# Patient Record
Sex: Female | Born: 1970 | Race: White | Hispanic: No | Marital: Married | Smoking: Never smoker
Health system: Southern US, Community
[De-identification: ages and names within clinical notes are randomized; demographics above are authoritative.]

## PROBLEM LIST (undated history)

## (undated) DIAGNOSIS — K648 Other hemorrhoids: Secondary | ICD-10-CM

## (undated) DIAGNOSIS — K449 Diaphragmatic hernia without obstruction or gangrene: Secondary | ICD-10-CM

## (undated) DIAGNOSIS — E785 Hyperlipidemia, unspecified: Secondary | ICD-10-CM

## (undated) HISTORY — DX: Other hemorrhoids: K64.8

## (undated) HISTORY — PX: OTHER SURGICAL HISTORY: SHX169

## (undated) HISTORY — DX: Diaphragmatic hernia without obstruction or gangrene: K44.9

## (undated) HISTORY — DX: Hyperlipidemia, unspecified: E78.5

---

## 2007-01-31 ENCOUNTER — Other Ambulatory Visit: Admission: RE | Admit: 2007-01-31 | Discharge: 2007-01-31 | Payer: Self-pay | Admitting: Family Medicine

## 2008-07-05 ENCOUNTER — Ambulatory Visit (HOSPITAL_COMMUNITY): Admission: RE | Admit: 2008-07-05 | Discharge: 2008-07-05 | Payer: Self-pay | Admitting: Family Medicine

## 2008-07-06 ENCOUNTER — Ambulatory Visit (HOSPITAL_COMMUNITY): Admission: RE | Admit: 2008-07-06 | Discharge: 2008-07-06 | Payer: Self-pay | Admitting: Family Medicine

## 2009-12-19 ENCOUNTER — Emergency Department (HOSPITAL_COMMUNITY): Admission: EM | Admit: 2009-12-19 | Discharge: 2009-12-19 | Payer: Self-pay | Admitting: Emergency Medicine

## 2010-07-06 ENCOUNTER — Encounter: Payer: Self-pay | Admitting: Family Medicine

## 2010-10-24 ENCOUNTER — Other Ambulatory Visit (HOSPITAL_COMMUNITY)
Admission: RE | Admit: 2010-10-24 | Discharge: 2010-10-24 | Disposition: A | Discharge status: Home / Self Care | Payer: BC Managed Care – PPO | Source: Ambulatory Visit | Attending: Family Medicine | Admitting: Family Medicine

## 2010-10-24 DIAGNOSIS — Z124 Encounter for screening for malignant neoplasm of cervix: Secondary | ICD-10-CM | POA: Insufficient documentation

## 2010-10-24 DIAGNOSIS — Z1159 Encounter for screening for other viral diseases: Secondary | ICD-10-CM | POA: Insufficient documentation

## 2013-01-12 ENCOUNTER — Other Ambulatory Visit: Payer: Self-pay

## 2013-01-12 DIAGNOSIS — Z1231 Encounter for screening mammogram for malignant neoplasm of breast: Secondary | ICD-10-CM

## 2013-01-26 ENCOUNTER — Other Ambulatory Visit: Payer: Self-pay | Admitting: Family Medicine

## 2013-01-26 ENCOUNTER — Ambulatory Visit
Admission: RE | Admit: 2013-01-26 | Discharge: 2013-01-26 | Disposition: A | Discharge status: Home / Self Care | Payer: BC Managed Care – PPO | Source: Ambulatory Visit

## 2013-01-26 DIAGNOSIS — R928 Other abnormal and inconclusive findings on diagnostic imaging of breast: Secondary | ICD-10-CM

## 2013-01-26 DIAGNOSIS — Z1231 Encounter for screening mammogram for malignant neoplasm of breast: Secondary | ICD-10-CM

## 2013-02-14 ENCOUNTER — Ambulatory Visit
Admission: RE | Admit: 2013-02-14 | Discharge: 2013-02-14 | Disposition: A | Discharge status: Home / Self Care | Payer: BC Managed Care – PPO | Source: Ambulatory Visit | Attending: Family Medicine | Admitting: Family Medicine

## 2013-02-14 DIAGNOSIS — R928 Other abnormal and inconclusive findings on diagnostic imaging of breast: Secondary | ICD-10-CM

## 2013-04-03 ENCOUNTER — Other Ambulatory Visit (HOSPITAL_COMMUNITY)
Admission: RE | Admit: 2013-04-03 | Discharge: 2013-04-03 | Disposition: A | Discharge status: Home / Self Care | Payer: BC Managed Care – PPO | Source: Ambulatory Visit | Attending: Family Medicine | Admitting: Family Medicine

## 2013-04-03 ENCOUNTER — Other Ambulatory Visit: Payer: Self-pay | Admitting: Family Medicine

## 2013-04-03 DIAGNOSIS — Z0000 Encounter for general adult medical examination without abnormal findings: Secondary | ICD-10-CM | POA: Insufficient documentation

## 2013-05-03 ENCOUNTER — Other Ambulatory Visit: Payer: Self-pay | Admitting: Family Medicine

## 2013-05-03 DIAGNOSIS — N926 Irregular menstruation, unspecified: Secondary | ICD-10-CM

## 2013-05-15 ENCOUNTER — Ambulatory Visit
Admission: RE | Admit: 2013-05-15 | Discharge: 2013-05-15 | Disposition: A | Discharge status: Home / Self Care | Payer: BC Managed Care – PPO | Source: Ambulatory Visit | Attending: Family Medicine | Admitting: Family Medicine

## 2013-05-15 DIAGNOSIS — N926 Irregular menstruation, unspecified: Secondary | ICD-10-CM

## 2014-04-17 ENCOUNTER — Other Ambulatory Visit: Payer: Self-pay

## 2014-04-17 DIAGNOSIS — Z1231 Encounter for screening mammogram for malignant neoplasm of breast: Secondary | ICD-10-CM

## 2014-05-21 ENCOUNTER — Ambulatory Visit
Admission: RE | Admit: 2014-05-21 | Discharge: 2014-05-21 | Disposition: A | Discharge status: Home / Self Care | Payer: BC Managed Care – PPO | Source: Ambulatory Visit

## 2014-05-21 DIAGNOSIS — Z1231 Encounter for screening mammogram for malignant neoplasm of breast: Secondary | ICD-10-CM

## 2014-05-23 ENCOUNTER — Other Ambulatory Visit: Payer: Self-pay | Admitting: Family Medicine

## 2014-05-23 DIAGNOSIS — R928 Other abnormal and inconclusive findings on diagnostic imaging of breast: Secondary | ICD-10-CM

## 2014-06-07 ENCOUNTER — Ambulatory Visit
Admission: RE | Admit: 2014-06-07 | Discharge: 2014-06-07 | Disposition: A | Discharge status: Home / Self Care | Payer: BC Managed Care – PPO | Source: Ambulatory Visit | Attending: Family Medicine | Admitting: Family Medicine

## 2014-06-07 DIAGNOSIS — R928 Other abnormal and inconclusive findings on diagnostic imaging of breast: Secondary | ICD-10-CM

## 2016-01-20 ENCOUNTER — Other Ambulatory Visit (HOSPITAL_COMMUNITY)
Admission: RE | Admit: 2016-01-20 | Discharge: 2016-01-20 | Disposition: A | Discharge status: Home / Self Care | Payer: BC Managed Care – PPO | Source: Ambulatory Visit | Attending: Family Medicine | Admitting: Family Medicine

## 2016-01-20 ENCOUNTER — Other Ambulatory Visit: Payer: Self-pay | Admitting: Family Medicine

## 2016-01-20 DIAGNOSIS — Z01419 Encounter for gynecological examination (general) (routine) without abnormal findings: Secondary | ICD-10-CM | POA: Insufficient documentation

## 2016-01-21 LAB — CYTOLOGY - PAP

## 2017-03-26 ENCOUNTER — Other Ambulatory Visit: Payer: Self-pay | Admitting: Family Medicine

## 2017-03-26 DIAGNOSIS — Z1231 Encounter for screening mammogram for malignant neoplasm of breast: Secondary | ICD-10-CM

## 2017-04-13 ENCOUNTER — Ambulatory Visit
Admission: RE | Admit: 2017-04-13 | Discharge: 2017-04-13 | Disposition: A | Discharge status: Home / Self Care | Payer: BC Managed Care – PPO | Source: Ambulatory Visit | Attending: Family Medicine | Admitting: Family Medicine

## 2017-04-13 DIAGNOSIS — Z1231 Encounter for screening mammogram for malignant neoplasm of breast: Secondary | ICD-10-CM

## 2018-11-29 ENCOUNTER — Other Ambulatory Visit: Payer: Self-pay | Admitting: Family Medicine

## 2018-11-29 ENCOUNTER — Ambulatory Visit
Admission: RE | Admit: 2018-11-29 | Discharge: 2018-11-29 | Disposition: A | Discharge status: Home / Self Care | Payer: BC Managed Care – PPO | Source: Ambulatory Visit | Attending: Family Medicine | Admitting: Family Medicine

## 2018-11-29 ENCOUNTER — Other Ambulatory Visit: Payer: Self-pay

## 2018-11-29 DIAGNOSIS — Z1231 Encounter for screening mammogram for malignant neoplasm of breast: Secondary | ICD-10-CM

## 2018-12-08 ENCOUNTER — Other Ambulatory Visit: Payer: Self-pay | Admitting: Family Medicine

## 2018-12-08 ENCOUNTER — Other Ambulatory Visit (HOSPITAL_COMMUNITY)
Admission: RE | Admit: 2018-12-08 | Discharge: 2018-12-08 | Disposition: A | Discharge status: Home / Self Care | Payer: BC Managed Care – PPO | Source: Ambulatory Visit | Attending: Family Medicine | Admitting: Family Medicine

## 2018-12-08 DIAGNOSIS — Z124 Encounter for screening for malignant neoplasm of cervix: Secondary | ICD-10-CM | POA: Insufficient documentation

## 2018-12-09 LAB — CYTOLOGY - PAP
Diagnosis: NEGATIVE
HPV: NOT DETECTED

## 2019-11-30 ENCOUNTER — Other Ambulatory Visit: Payer: Self-pay | Admitting: Family Medicine

## 2019-11-30 DIAGNOSIS — Z1231 Encounter for screening mammogram for malignant neoplasm of breast: Secondary | ICD-10-CM

## 2019-12-01 ENCOUNTER — Other Ambulatory Visit: Payer: Self-pay

## 2019-12-01 ENCOUNTER — Ambulatory Visit
Admission: RE | Admit: 2019-12-01 | Discharge: 2019-12-01 | Disposition: A | Discharge status: Home / Self Care | Payer: BC Managed Care – PPO | Source: Ambulatory Visit | Attending: Family Medicine | Admitting: Family Medicine

## 2019-12-01 DIAGNOSIS — Z1231 Encounter for screening mammogram for malignant neoplasm of breast: Secondary | ICD-10-CM

## 2020-01-15 ENCOUNTER — Encounter: Payer: Self-pay | Admitting: Cardiology

## 2020-01-18 NOTE — Progress Notes (Signed)
Cardiology Office Note   Date:  01/19/2020   ID:  Jodi Blackburn, DOB 1971-03-06, MRN [id number]  PCP:  Laurann Montana, MD  Cardiologist:   Rollene Rotunda, MD Referring:  Laurann Montana, MD  Chief Complaint  Patient presents with   Shortness of Breath   Edema      History of Present Illness: Jodi Blackburn is a 49 y.o. female who was referred by Laurann Montana, MD for evaluation cardiac decreased exercise tolerance.  The patient has no prior cardiac history.  She presents with a constellation of symptoms that includes occasional dizziness.  She has had numbness in her feet and hands.  Has had occasional intermittent ankle swelling and a bloating feeling.  She ascribes all of this to probably going through menopause.  She might notice some shortness of breath with activity such as vacuuming.  She uses an elliptical and she needs to take breaks.  She feels sometimes like she cannot take a deep breath.  She has had vague squeezing chest discomfort.  She is occasionally had some episodes of being pale and presyncope but this is very uncommon.  She is never had any prior cardiac history.  She is not had any prior cardiac work-up.  She is not describing substernal chest pressure, neck or arm discomfort.  She is not describing PND or orthopnea.  Past Medical History:  Diagnosis Date   Dyslipidemia    Hiatal hernia    Internal hemorrhoids     Past Surgical History:  Procedure Laterality Date   None       Current Outpatient Medications  Medication Sig Dispense Refill   Cholecalciferol 25 MCG (1000 UT) tablet Take by mouth.     esomeprazole (NEXIUM) 20 MG capsule Take by mouth.     estradiol (ESTRACE) 0.1 MG/GM vaginal cream Place vaginally.     fluticasone (FLONASE) 50 MCG/ACT nasal spray Place into the nose.     No current facility-administered medications for this visit.    Allergies:   Patient has no allergy information on record.    Social History:  The patient   reports that she has never smoked. She has never used smokeless tobacco. She reports current alcohol use of about 2.0 standard drinks of alcohol per week. She reports that she does not use drugs.   Family History:  The patient's family history includes Hypertension in her mother and sister.    ROS:  Please see the history of present illness.   Otherwise, review of systems are positive for none.   All other systems are reviewed and negative.    PHYSICAL EXAM: VS:  BP 116/78 (BP Location: Left Arm, Patient Position: Sitting, Cuff Size: Normal)    Pulse (!) 50    Ht 5\' 6"  (1.676 m)    Wt 168 lb 3.2 oz (76.3 kg)    BMI 27.15 kg/m  , BMI Body mass index is 27.15 kg/m. GENERAL:  Well appearing HEENT:  Pupils equal round and reactive, fundi not visualized, oral mucosa unremarkable NECK:  No jugular venous distention, waveform within normal limits, carotid upstroke brisk and symmetric, no bruits, no thyromegaly LYMPHATICS:  No cervical, inguinal adenopathy LUNGS:  Clear to auscultation bilaterally BACK:  No CVA tenderness CHEST:  Unremarkable HEART:  PMI not displaced or sustained,S1 and S2 within normal limits, no S3, no S4, no clicks, no rubs, no murmurs ABD:  Flat, positive bowel sounds normal in frequency in pitch, no bruits, no rebound, no guarding, no midline pulsatile  mass, no hepatomegaly, no splenomegaly EXT:  2 plus pulses throughout, no edema, no cyanosis no clubbing SKIN:  No rashes no nodules NEURO:  Cranial nerves II through XII grossly intact, motor grossly intact throughout PSYCH:  Cognitively intact, oriented to person place and time    EKG:  EKG is ordered today. The ekg ordered today demonstrates sinus bradycardia, rate 50, axis within normal limits, short PR interval, low voltage limb and chest leads, no acute ST-T wave changes.   Recent Labs: No results found for requested labs within last 8760 hours.    Lipid Panel No results found for: CHOL, TRIG, HDL, CHOLHDL,  VLDL, LDLCALC, LDLDIRECT    Wt Readings from Last 3 Encounters:  01/19/20 168 lb 3.2 oz (76.3 kg)      Other studies Reviewed: Additional studies/ records that were reviewed today include: Labs and primary care records. Review of the above records demonstrates:  Please see elsewhere in the note.     ASSESSMENT AND PLAN:  Bradycardia:    The patient does have bradycardia but no symptoms related to this.  Her heart rate went up appropriately when she ambulated around the office.  She says it goes up appropriately with exercise.  At this point no change in therapy.  Dyslipidemia: Her LDL has been significantly elevated at 167.  I am going to start with a coronary calcium score which will help [place] understand goals of therapy and whether any further screening is necessary.  Decreased exercise tolerance: She has an atypical constellation of symptoms.  She is not having any chest discomfort suggestive of unstable angina.  She has minimal cardiovascular risk factors.  She has some low voltages on her EKG but is otherwise unremarkable.  Blood work has been unremarkable.  I am going to start with the testing above but at this point would not have a high suspicion of obstructive coronary disease or structural heart disease.  (See below in the comments regarding the echocardiogram)  Abnormal EKG: She has some EKG with some low voltages but otherwise unremarkable.  I am going to have a low threshold to screen her with an echocardiogram pending the results above.  Dizziness: Patient has some vague symptoms of lightheadedness dizziness and did have a slight drop in her blood pressure with standing.  She has baseline low blood pressure.  I have asked her to hydrate.  Edema: She has had some intermittent leg swelling but otherwise no suggestion of venous problems or heart failure.  There is no evidence of swelling today.  She is to watch salt and fluid intake and see if there is some correlation.  At this  point no change in therapy.  COVID education: She has had her vaccine.  Current medicines are reviewed at length with the patient today.  The patient does not have concerns regarding medicines.  The following changes have been made:  no change  Labs/ tests ordered today include:   Orders Placed This Encounter  Procedures   CT CARDIAC SCORING   EKG 12-Lead     Disposition:   FU with as needed and as based on the results of the testing above.   Signed, Rollene Rotunda, MD  01/19/2020 12:46 PM    [facility]

## 2020-01-19 ENCOUNTER — Encounter: Payer: Self-pay | Admitting: Cardiology

## 2020-01-19 ENCOUNTER — Other Ambulatory Visit: Payer: Self-pay

## 2020-01-19 ENCOUNTER — Ambulatory Visit (INDEPENDENT_AMBULATORY_CARE_PROVIDER_SITE_OTHER): Payer: BC Managed Care – PPO | Admitting: Cardiology

## 2020-01-19 VITALS — BP 116/78 | HR 50 | Ht 66.0 in | Wt 168.2 lb

## 2020-01-19 DIAGNOSIS — R6889 Other general symptoms and signs: Secondary | ICD-10-CM | POA: Diagnosis not present

## 2020-01-19 DIAGNOSIS — R001 Bradycardia, unspecified: Secondary | ICD-10-CM | POA: Diagnosis not present

## 2020-01-19 DIAGNOSIS — R9431 Abnormal electrocardiogram [ECG] [EKG]: Secondary | ICD-10-CM

## 2020-01-19 DIAGNOSIS — Z7189 Other specified counseling: Secondary | ICD-10-CM

## 2020-01-19 NOTE — Patient Instructions (Signed)
Medication Instructions:  Your physician recommends that you continue on your current medications as directed. Please refer to the Current Medication list given to you today.  *If you need a refill on your cardiac medications before your next appointment, please call your pharmacy*   Testing/Procedures: Your physician has requested that you have cardiac CT (Calcium Score). Cardiac computed tomography (CT) is a painless test that uses an x-ray machine to take clear, detailed pictures of your heart. For further information please visit [email or web address] Please follow instruction sheet as given.   Follow-Up: At [facility], you and your health needs are our priority.  As part of our continuing mission to provide you with exceptional heart care, we have created designated Provider Care Teams.  These Care Teams include your primary Cardiologist (physician) and Advanced Practice Providers (APPs -  Physician Assistants and Nurse Practitioners) who all work together to provide you with the care you need, when you need it.  We recommend signing up for the patient portal called "MyChart".  Sign up information is provided on this After Visit Summary.  MyChart is used to connect with patients for Virtual Visits (Telemedicine).  Patients are able to view lab/test results, encounter notes, upcoming appointments, etc.  Non-urgent messages can be sent to your provider as well.   To learn more about what you can do with MyChart, go to [email or web address].    Your next appointment:   We will call you with your results to let you know when to follow-up.

## 2020-01-25 ENCOUNTER — Other Ambulatory Visit: Payer: Self-pay

## 2020-01-25 ENCOUNTER — Ambulatory Visit (INDEPENDENT_AMBULATORY_CARE_PROVIDER_SITE_OTHER)
Admission: RE | Admit: 2020-01-25 | Discharge: 2020-01-25 | Disposition: A | Discharge status: Home / Self Care | Payer: Self-pay | Source: Ambulatory Visit | Attending: Cardiology | Admitting: Cardiology

## 2020-01-25 DIAGNOSIS — R9431 Abnormal electrocardiogram [ECG] [EKG]: Secondary | ICD-10-CM

## 2020-02-02 ENCOUNTER — Telehealth: Payer: Self-pay | Admitting: Cardiology

## 2020-02-02 DIAGNOSIS — R0602 Shortness of breath: Secondary | ICD-10-CM

## 2020-02-02 DIAGNOSIS — R9431 Abnormal electrocardiogram [ECG] [EKG]: Secondary | ICD-10-CM

## 2020-02-02 NOTE — Telephone Encounter (Signed)
Patient is returning call to discuss results from CT completed on 01/25/20.

## 2020-02-02 NOTE — Telephone Encounter (Signed)
Left message for pt to call.

## 2020-02-05 NOTE — Telephone Encounter (Signed)
Lm to call back ./cy 

## 2020-02-06 NOTE — Telephone Encounter (Signed)
Spoke to patient coronary calcium score results given.Dr.Hochrein advised needs a echo.Scheduler will call back with echo appointment.

## 2020-02-12 ENCOUNTER — Telehealth: Payer: Self-pay | Admitting: *Deleted

## 2020-02-12 NOTE — Telephone Encounter (Signed)
-----   Message from Rollene Rotunda, MD sent at 01/26/2020  5:54 PM EDT ----- She had no calcium and no abnormalities on this EKG.  I would suggest a follow up echo because there are some low voltages on the EKG that are non specific.  However, for completeness, and with her symptoms, I would like to check an echo.  I tried to call her Friday at 6 pm but could not leave a message.  Call [name] with the results and send results to Laurann Montana, MD

## 2020-02-12 NOTE — Telephone Encounter (Signed)
Spoke with patient and advised of results Has Echo scheduled for next week Per patient she does have history of syncope, last episode a couple of years ago States she has issues with dizziness/lightheadedness  Recent labs at PCP were ok  She continues to have intermittent chest pain Concerned about low heartate  Will forward to Dr Antoine Poche for review

## 2020-02-15 NOTE — Telephone Encounter (Signed)
Can schedule follow up with me after the echo.

## 2020-02-23 ENCOUNTER — Ambulatory Visit (HOSPITAL_COMMUNITY): Payer: BC Managed Care – PPO | Attending: Cardiovascular Disease

## 2020-02-23 ENCOUNTER — Other Ambulatory Visit: Payer: Self-pay

## 2020-02-23 DIAGNOSIS — R9431 Abnormal electrocardiogram [ECG] [EKG]: Secondary | ICD-10-CM | POA: Diagnosis not present

## 2020-02-23 DIAGNOSIS — R0602 Shortness of breath: Secondary | ICD-10-CM | POA: Insufficient documentation

## 2020-02-23 LAB — ECHOCARDIOGRAM COMPLETE
Area-P 1/2: 4.96 cm2
S' Lateral: 3.1 cm

## 2020-02-23 NOTE — Telephone Encounter (Signed)
Advised patient and sent message to schedulers to arrange.

## 2020-02-26 NOTE — Telephone Encounter (Signed)
Advised patient of appointment date and time. 

## 2020-02-26 NOTE — Telephone Encounter (Signed)
Scheduled patient appointment 02/29/2020 at 10:00, left message to call back

## 2020-02-28 DIAGNOSIS — R42 Dizziness and giddiness: Secondary | ICD-10-CM | POA: Insufficient documentation

## 2020-02-28 DIAGNOSIS — M7989 Other specified soft tissue disorders: Secondary | ICD-10-CM | POA: Insufficient documentation

## 2020-02-28 NOTE — Progress Notes (Signed)
Cardiology Office Note   Date:  02/29/2020   ID:  Jodi Blackburn, DOB 1970-09-26, MRN [id number]  PCP:  Laurann Montana, MD  Cardiologist:   Rollene Rotunda, MD Referring:  Laurann Montana, MD   Chief Complaint  Patient presents with  . Decreased exercise tolerance     History of Present Illness: Jodi Blackburn is a 49 y.o. female who was referred by Laurann Montana, MD for evaluation cardiac decreased exercise tolerance.   She had an unremarkable echo.  Coronary calcium was zero.     She returns today for follow up and has done OK.  She has no new complaints.  She still has some vague symptoms of some mild leg swelling and decreased exercise tolerance but no acute chest pressure, neck or arm discomfort.  She had no new medications, presyncope or syncope.   Past Medical History:  Diagnosis Date  . Dyslipidemia   . Hiatal hernia   . Internal hemorrhoids     Past Surgical History:  Procedure Laterality Date  . None       Current Outpatient Medications  Medication Sig Dispense Refill  . Cholecalciferol 25 MCG (1000 UT) tablet Take by mouth.    . esomeprazole (NEXIUM) 20 MG capsule Take by mouth.    . estradiol (ESTRACE) 0.1 MG/GM vaginal cream Place vaginally.    . fluticasone (FLONASE) 50 MCG/ACT nasal spray Place into the nose.     No current facility-administered medications for this visit.    Allergies:   Patient has no allergy information on record.    ROS:  Please see the history of present illness.   Otherwise, review of systems are positive for none.   All other systems are reviewed and negative.    PHYSICAL EXAM: VS:  BP 112/60   Pulse 74   Temp (!) 97.5 F (36.4 C)   Ht 5\' 6"  (1.676 m)   Wt 167 lb (75.8 kg)   SpO2 98%   BMI 26.95 kg/m  , BMI Body mass index is 26.95 kg/m. GENERAL:  Well appearing NECK:  No jugular venous distention, waveform within normal limits, carotid upstroke brisk and symmetric, no bruits, no thyromegaly LUNGS:  Clear to  auscultation bilaterally CHEST:  Unremarkable HEART:  PMI not displaced or sustained,S1 and S2 within normal limits, no S3, no S4, no clicks, no rubs, no murmurs ABD:  Flat, positive bowel sounds normal in frequency in pitch, no bruits, no rebound, no guarding, no midline pulsatile mass, no hepatomegaly, no splenomegaly EXT:  2 plus pulses throughout, no edema, no cyanosis no clubbing   EKG:  EKG is not ordered today.  Recent Labs: No results found for requested labs within last 8760 hours.    Lipid Panel No results found for: CHOL, TRIG, HDL, CHOLHDL, VLDL, LDLCALC, LDLDIRECT    Wt Readings from Last 3 Encounters:  02/29/20 167 lb (75.8 kg)  01/19/20 168 lb 3.2 oz (76.3 kg)      Other studies Reviewed: Additional studies/ records that were reviewed today include:   None Review of the above records demonstrates:  Please see elsewhere in the note.     ASSESSMENT AND PLAN:  Bradycardia:     Her heart rate goes up with activity.  She has no further episodes of syncope.  Of note she did not tell me about this but she did have an episode of this apparently many months ago and was evaluated.  At this point I do not think she is having  any issues that would require monitoring but she would let me know if she has any presyncope or syncope in the future.  Dyslipidemia:  The calcium score was zero.  Given the calcium score of 0 she would need good diet control but not a statin at this point.  She can have this followed by her primary provider.  Decreased exercise tolerance:   Given a 0 calcium score and normal echocardiogram she is instructed to go out and start exercise and she should have improvement in her exercise tolerance with the regimen that she slowly increases.  If not she will let me know but at this point I am not suspecting cardiac limitations or suggesting any further cardiac evaluation.   COVID education:   Infection already had her vaccine but she actually had not.  We had a  long conversation about this today and she thinks she will get it.  Current medicines are reviewed at length with the patient today.  The patient does not have concerns regarding medicines.  The following changes have been made:  None  Labs/ tests ordered today include: None  No orders of the defined types were placed in this encounter.    Disposition:   FU me as needed.     Signed, Rollene Rotunda, MD  02/29/2020 10:52 AM    [facility]

## 2020-02-29 ENCOUNTER — Ambulatory Visit (INDEPENDENT_AMBULATORY_CARE_PROVIDER_SITE_OTHER): Payer: BC Managed Care – PPO | Admitting: Cardiology

## 2020-02-29 ENCOUNTER — Encounter: Payer: Self-pay | Admitting: Cardiology

## 2020-02-29 ENCOUNTER — Other Ambulatory Visit: Payer: Self-pay

## 2020-02-29 VITALS — BP 112/60 | HR 74 | Temp 97.5°F | Ht 66.0 in | Wt 167.0 lb

## 2020-02-29 DIAGNOSIS — R42 Dizziness and giddiness: Secondary | ICD-10-CM | POA: Diagnosis not present

## 2020-02-29 DIAGNOSIS — M7989 Other specified soft tissue disorders: Secondary | ICD-10-CM | POA: Diagnosis not present

## 2020-02-29 NOTE — Patient Instructions (Signed)
Medication Instructions:  Your physician recommends that you continue on your current medications as directed. Please refer to the Current Medication list given to you today.  *If you need a refill on your cardiac medications before your next appointment, please call your pharmacy*  Follow-Up: At [facility], you and your health needs are our priority.  As part of our continuing mission to provide you with exceptional heart care, we have created designated Provider Care Teams.  These Care Teams include your primary Cardiologist (physician) and Advanced Practice Providers (APPs -  Physician Assistants and Nurse Practitioners) who all work together to provide you with the care you need, when you need it.  We recommend signing up for the patient portal called "MyChart".  Sign up information is provided on this After Visit Summary.  MyChart is used to connect with patients for Virtual Visits (Telemedicine).  Patients are able to view lab/test results, encounter notes, upcoming appointments, etc.  Non-urgent messages can be sent to your provider as well.   To learn more about what you can do with MyChart, go to [email or web address].    Your next appointment:   AS NEEDED with Dr. Hochrein   

## 2020-11-27 ENCOUNTER — Other Ambulatory Visit: Payer: Self-pay | Admitting: Family Medicine

## 2020-11-27 DIAGNOSIS — Z1231 Encounter for screening mammogram for malignant neoplasm of breast: Secondary | ICD-10-CM

## 2021-01-22 ENCOUNTER — Ambulatory Visit
Admission: RE | Admit: 2021-01-22 | Discharge: 2021-01-22 | Disposition: A | Discharge status: Home / Self Care | Payer: BC Managed Care – PPO | Source: Ambulatory Visit | Attending: Family Medicine | Admitting: Family Medicine

## 2021-01-22 ENCOUNTER — Other Ambulatory Visit: Payer: Self-pay

## 2021-01-22 DIAGNOSIS — Z1231 Encounter for screening mammogram for malignant neoplasm of breast: Secondary | ICD-10-CM

## 2022-01-23 ENCOUNTER — Other Ambulatory Visit: Payer: Self-pay | Admitting: Family Medicine

## 2022-01-23 DIAGNOSIS — Z1231 Encounter for screening mammogram for malignant neoplasm of breast: Secondary | ICD-10-CM

## 2022-02-06 ENCOUNTER — Ambulatory Visit
Admission: RE | Admit: 2022-02-06 | Discharge: 2022-02-06 | Disposition: A | Discharge status: Home / Self Care | Payer: BC Managed Care – PPO | Source: Ambulatory Visit | Attending: Family Medicine | Admitting: Family Medicine

## 2022-02-06 DIAGNOSIS — Z1231 Encounter for screening mammogram for malignant neoplasm of breast: Secondary | ICD-10-CM

## 2023-01-04 ENCOUNTER — Other Ambulatory Visit: Payer: Self-pay | Admitting: Family Medicine

## 2023-01-04 DIAGNOSIS — Z1231 Encounter for screening mammogram for malignant neoplasm of breast: Secondary | ICD-10-CM

## 2023-02-17 ENCOUNTER — Ambulatory Visit: Payer: BC Managed Care – PPO

## 2023-02-19 ENCOUNTER — Ambulatory Visit
Admission: RE | Admit: 2023-02-19 | Discharge: 2023-02-19 | Disposition: A | Discharge status: Home / Self Care | Payer: BC Managed Care – PPO | Source: Ambulatory Visit | Attending: Family Medicine | Admitting: Family Medicine

## 2023-02-19 DIAGNOSIS — Z1231 Encounter for screening mammogram for malignant neoplasm of breast: Secondary | ICD-10-CM

## 2023-02-23 ENCOUNTER — Other Ambulatory Visit: Payer: Self-pay | Admitting: Family Medicine

## 2023-02-23 DIAGNOSIS — R928 Other abnormal and inconclusive findings on diagnostic imaging of breast: Secondary | ICD-10-CM

## 2023-03-04 ENCOUNTER — Ambulatory Visit
Admission: RE | Admit: 2023-03-04 | Discharge: 2023-03-04 | Disposition: A | Discharge status: Home / Self Care | Payer: BC Managed Care – PPO | Source: Ambulatory Visit | Attending: Family Medicine | Admitting: Family Medicine

## 2023-03-04 ENCOUNTER — Ambulatory Visit: Payer: BC Managed Care – PPO

## 2023-03-04 DIAGNOSIS — R928 Other abnormal and inconclusive findings on diagnostic imaging of breast: Secondary | ICD-10-CM

## 2023-03-20 IMAGING — MG MM DIGITAL SCREENING BILAT W/ TOMO AND CAD
8 series · 9 of 24 positions shown · non-contrast
Comparison: Previous exam(s).

CLINICAL DATA: Screening.

EXAM:
DIGITAL SCREENING BILATERAL MAMMOGRAM WITH TOMOSYNTHESIS AND CAD
TECHNIQUE: Bilateral screening digital craniocaudal and mediolateral oblique
mammograms were obtained. Bilateral screening digital breast
tomosynthesis was performed. The images were evaluated with
computer-aided detection.

[R CC synth-2D]
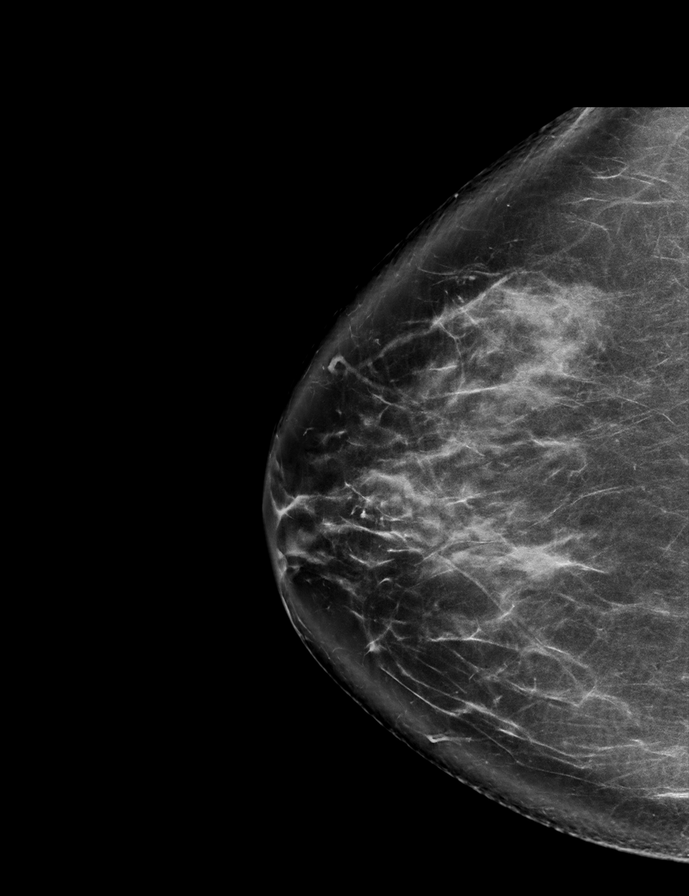

[R MLO synth-2D]
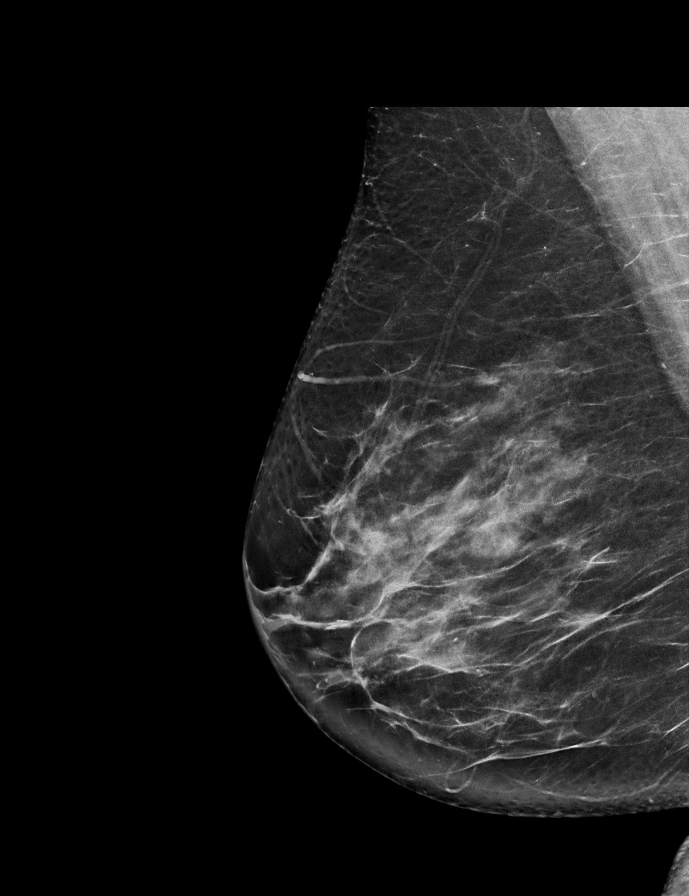

[L CC synth-2D]
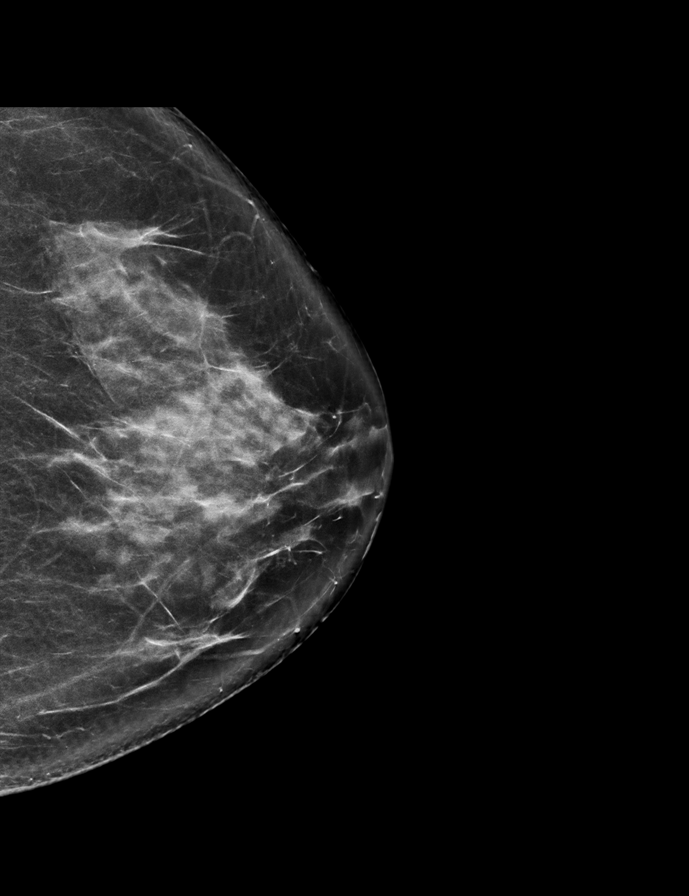

[L MLO synth-2D]
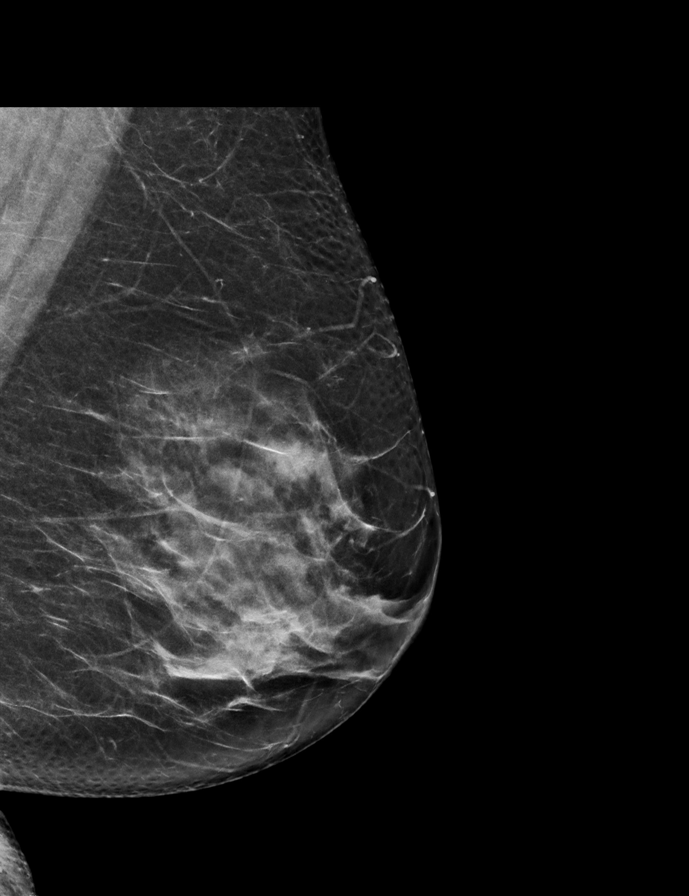

[R CC tomo · 2 of 83 frames shown]
[frame 27/83]
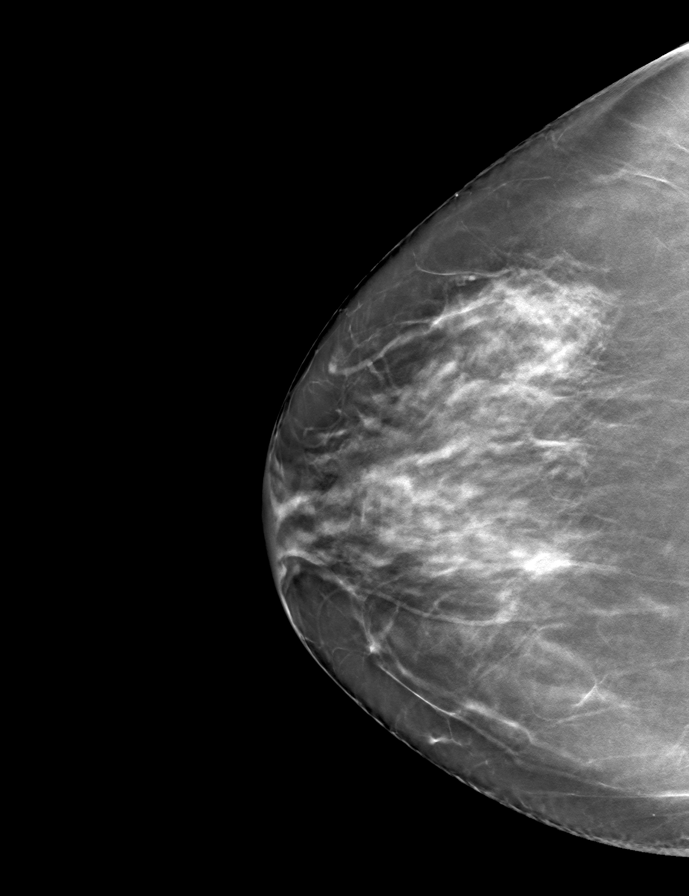
[frame 42/83]
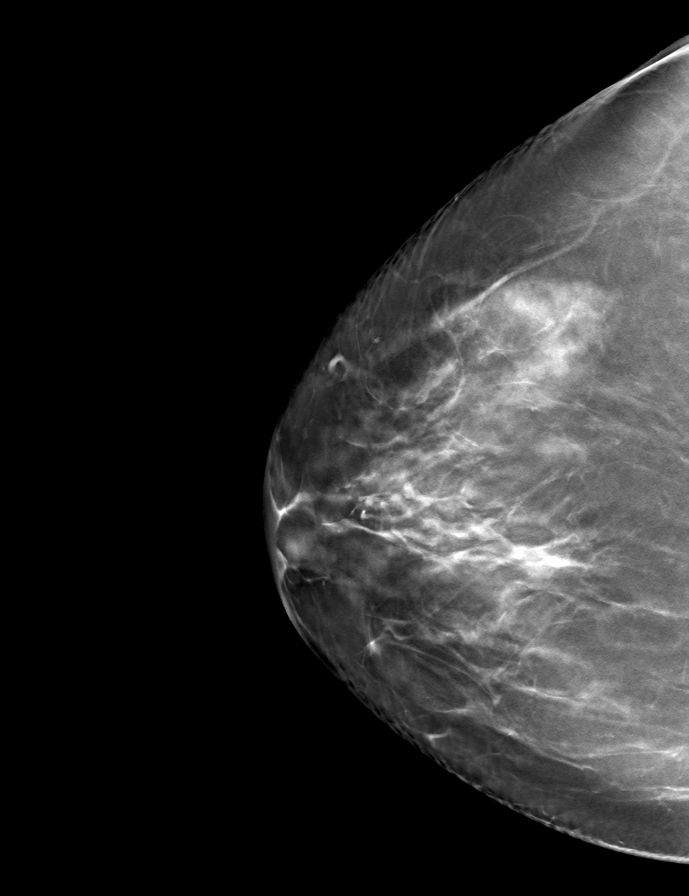

[R MLO tomo · tomo slice 40/79.0]
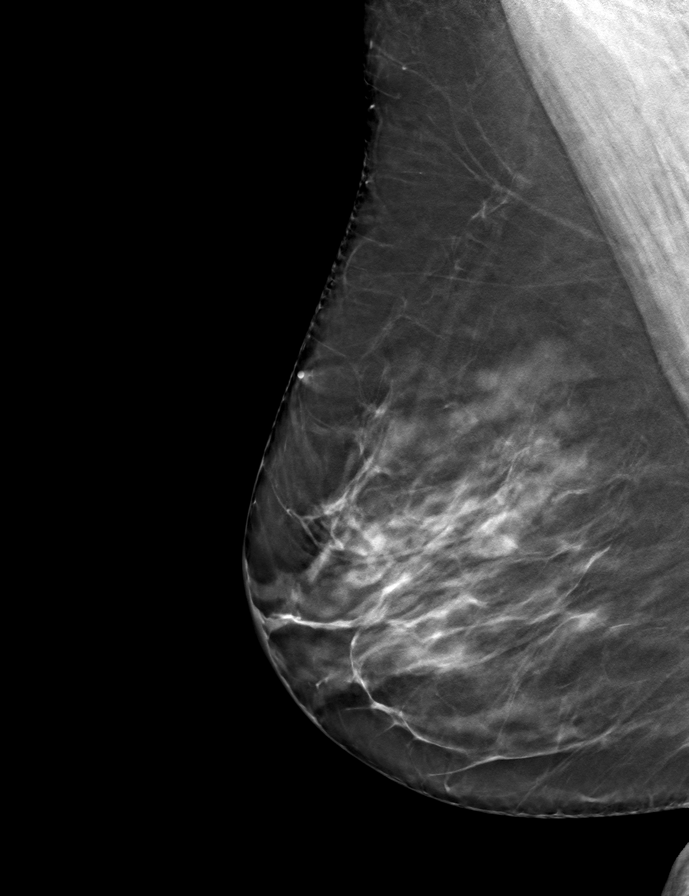

[L MLO tomo · tomo slice 36/71.0]
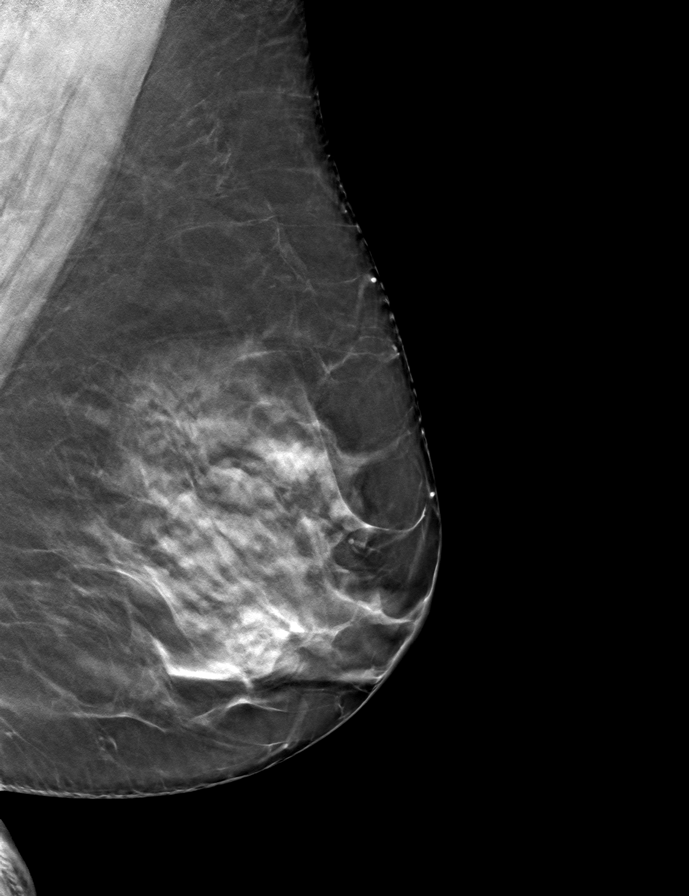

[L CC tomo · tomo slice 38/75.0]
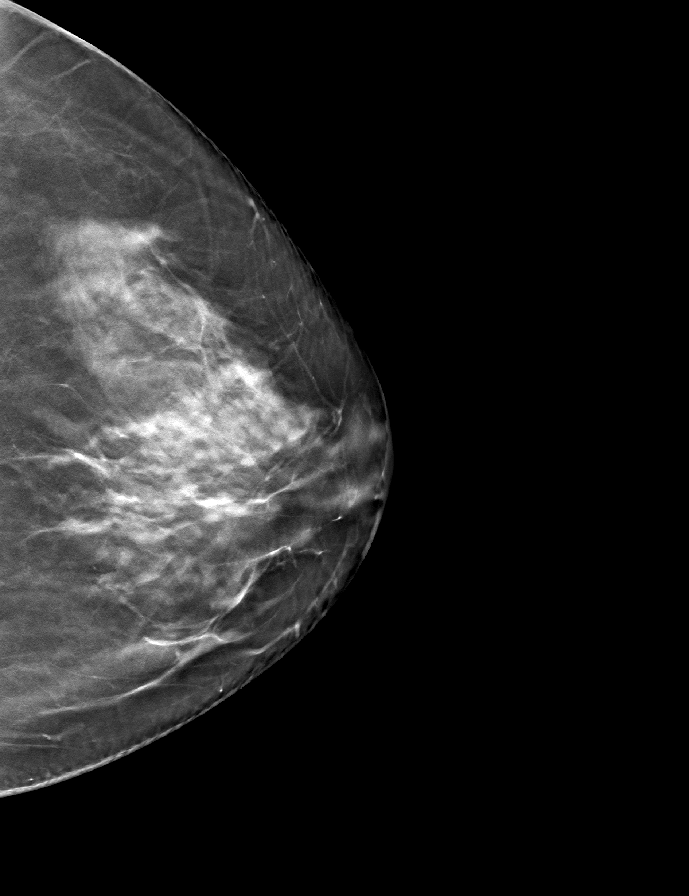

[9 of 24 positions shown; findings below may reference images not displayed]

ACR Breast Density Category c: The breast tissue is heterogeneously
dense, which may obscure small masses.
FINDINGS: There are no findings suspicious for malignancy.
IMPRESSION: No mammographic evidence of malignancy. A result letter of this
screening mammogram will be mailed directly to the patient.

RECOMMENDATION:
Screening mammogram in one year. (Code:Q3-W-BC3)

BI-RADS CATEGORY  1: Negative.

## 2024-04-05 ENCOUNTER — Other Ambulatory Visit: Payer: Self-pay | Admitting: Family Medicine

## 2024-04-05 DIAGNOSIS — Z1231 Encounter for screening mammogram for malignant neoplasm of breast: Secondary | ICD-10-CM

## 2024-04-25 ENCOUNTER — Ambulatory Visit
Admission: RE | Admit: 2024-04-25 | Discharge: 2024-04-25 | Disposition: A | Discharge status: Home / Self Care | Payer: Self-pay | Source: Ambulatory Visit | Attending: Family Medicine | Admitting: Family Medicine

## 2024-04-25 DIAGNOSIS — Z1231 Encounter for screening mammogram for malignant neoplasm of breast: Secondary | ICD-10-CM
